# Patient Record
Sex: Male | Born: 1935 | Race: White | Hispanic: No | Marital: Single
Health system: Southern US, Community
[De-identification: ages and names within clinical notes are randomized; demographics above are authoritative.]

---

## 2005-01-18 ENCOUNTER — Ambulatory Visit: Payer: Self-pay | Admitting: Gastroenterology

## 2005-02-14 ENCOUNTER — Other Ambulatory Visit: Payer: Self-pay

## 2005-02-14 ENCOUNTER — Emergency Department: Payer: Self-pay | Admitting: Emergency Medicine

## 2007-02-20 ENCOUNTER — Other Ambulatory Visit: Payer: Self-pay

## 2007-02-20 ENCOUNTER — Inpatient Hospital Stay: Payer: Self-pay | Admitting: Internal Medicine

## 2008-03-01 ENCOUNTER — Other Ambulatory Visit: Payer: Self-pay

## 2008-03-01 ENCOUNTER — Emergency Department: Payer: Self-pay | Admitting: Emergency Medicine

## 2010-05-21 ENCOUNTER — Emergency Department: Payer: Self-pay | Admitting: Emergency Medicine

## 2011-05-14 ENCOUNTER — Emergency Department: Payer: Self-pay | Admitting: Emergency Medicine

## 2011-08-22 ENCOUNTER — Emergency Department: Payer: Self-pay | Admitting: Emergency Medicine

## 2012-04-15 LAB — COMPREHENSIVE METABOLIC PANEL
Albumin: 3.5 g/dL (ref 3.4–5.0)
Alkaline Phosphatase: 120 U/L (ref 50–136)
Anion Gap: 9 (ref 7–16)
BUN: 39 mg/dL — ABNORMAL HIGH (ref 7–18)
Bilirubin,Total: 0.3 mg/dL (ref 0.2–1.0)
EGFR (Non-African Amer.): 27 — ABNORMAL LOW
Glucose: 104 mg/dL — ABNORMAL HIGH (ref 65–99)
Osmolality: 295 (ref 275–301)
Potassium: 4 mmol/L (ref 3.5–5.1)
SGOT(AST): 27 U/L (ref 15–37)
Sodium: 143 mmol/L (ref 136–145)
Total Protein: 7 g/dL (ref 6.4–8.2)

## 2012-04-15 LAB — CBC WITH DIFFERENTIAL/PLATELET
Basophil #: 0 10*3/uL (ref 0.0–0.1)
Eosinophil #: 0 10*3/uL (ref 0.0–0.7)
MCH: 33.3 pg (ref 26.0–34.0)
MCHC: 33.8 g/dL (ref 32.0–36.0)
Monocyte #: 1.6 x10 3/mm — ABNORMAL HIGH (ref 0.2–1.0)
Neutrophil %: 73.4 %
Platelet: 151 10*3/uL (ref 150–440)
RBC: 3.4 10*6/uL — ABNORMAL LOW (ref 4.40–5.90)
RDW: 14.9 % — ABNORMAL HIGH (ref 11.5–14.5)
WBC: 12.8 10*3/uL — ABNORMAL HIGH (ref 3.8–10.6)

## 2012-04-15 LAB — TSH: Thyroid Stimulating Horm: 1.02 u[IU]/mL

## 2012-04-16 ENCOUNTER — Observation Stay: Payer: Self-pay | Admitting: Internal Medicine

## 2012-04-16 LAB — URINALYSIS, COMPLETE
Bilirubin,UR: NEGATIVE
Glucose,UR: NEGATIVE mg/dL (ref 0–75)
Nitrite: NEGATIVE
Protein: NEGATIVE
Specific Gravity: 1.011 (ref 1.003–1.030)
WBC UR: 1 /HPF (ref 0–5)

## 2012-04-16 LAB — CK: CK, Total: 408 U/L — ABNORMAL HIGH (ref 35–232)

## 2012-04-17 LAB — BASIC METABOLIC PANEL
Anion Gap: 6 — ABNORMAL LOW (ref 7–16)
BUN: 33 mg/dL — ABNORMAL HIGH (ref 7–18)
Calcium, Total: 8 mg/dL — ABNORMAL LOW (ref 8.5–10.1)
Chloride: 107 mmol/L (ref 98–107)
Creatinine: 1.91 mg/dL — ABNORMAL HIGH (ref 0.60–1.30)
EGFR (African American): 35 — ABNORMAL LOW
Glucose: 94 mg/dL (ref 65–99)
Osmolality: 290 (ref 275–301)
Potassium: 4.2 mmol/L (ref 3.5–5.1)

## 2012-04-17 LAB — CBC WITH DIFFERENTIAL/PLATELET
Basophil #: 0 10*3/uL (ref 0.0–0.1)
Basophil %: 0.3 %
Eosinophil %: 1.4 %
Lymphocyte %: 15.3 %
MCH: 33.5 pg (ref 26.0–34.0)
Monocyte %: 11.1 %
Neutrophil %: 71.9 %
Platelet: 141 10*3/uL — ABNORMAL LOW (ref 150–440)
RDW: 15.3 % — ABNORMAL HIGH (ref 11.5–14.5)
WBC: 11.9 10*3/uL — ABNORMAL HIGH (ref 3.8–10.6)

## 2012-04-17 LAB — URINE CULTURE

## 2013-08-19 DEATH — deceased

## 2014-03-19 IMAGING — CR DG LUMBAR SPINE 2-3V
1 series · 3 of 3 positions shown · non-contrast
Comparison: none

REASON FOR EXAM: s/p fall and back pain
COMMENTS:   LMP: (Male)

PROCEDURE:     DXR - DXR LUMBAR SPINE AP AND LATERAL  - April 16, 2012  [DATE]
RESULT:     Comparison: None

[Series 2: t lumbar spine ap · 0.14mm/px · 3 of 3 slices shown]
[im 1/3]
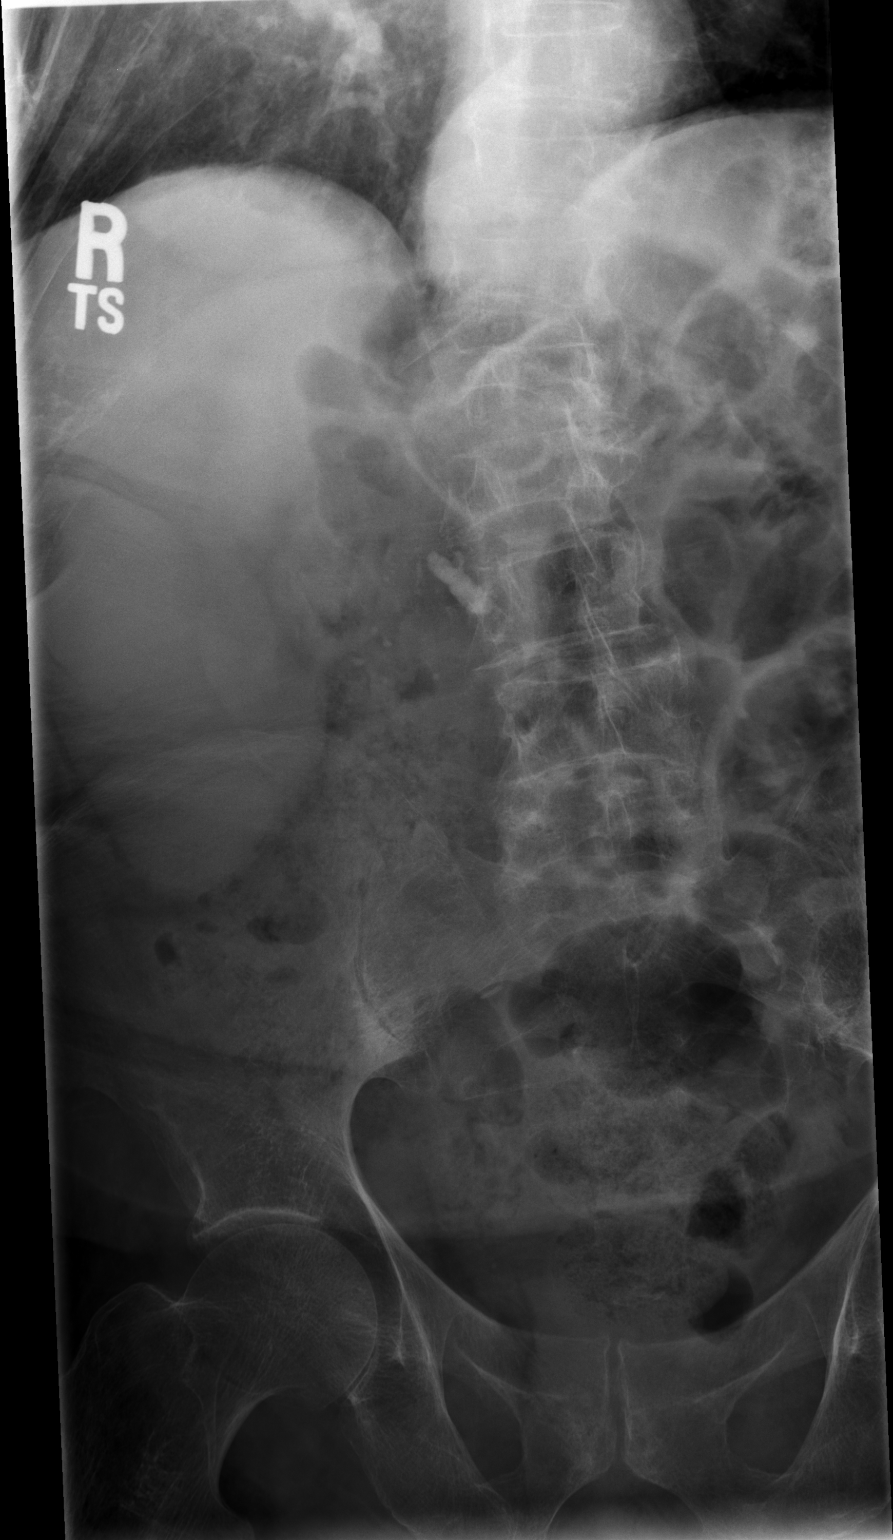
[im 2/3]
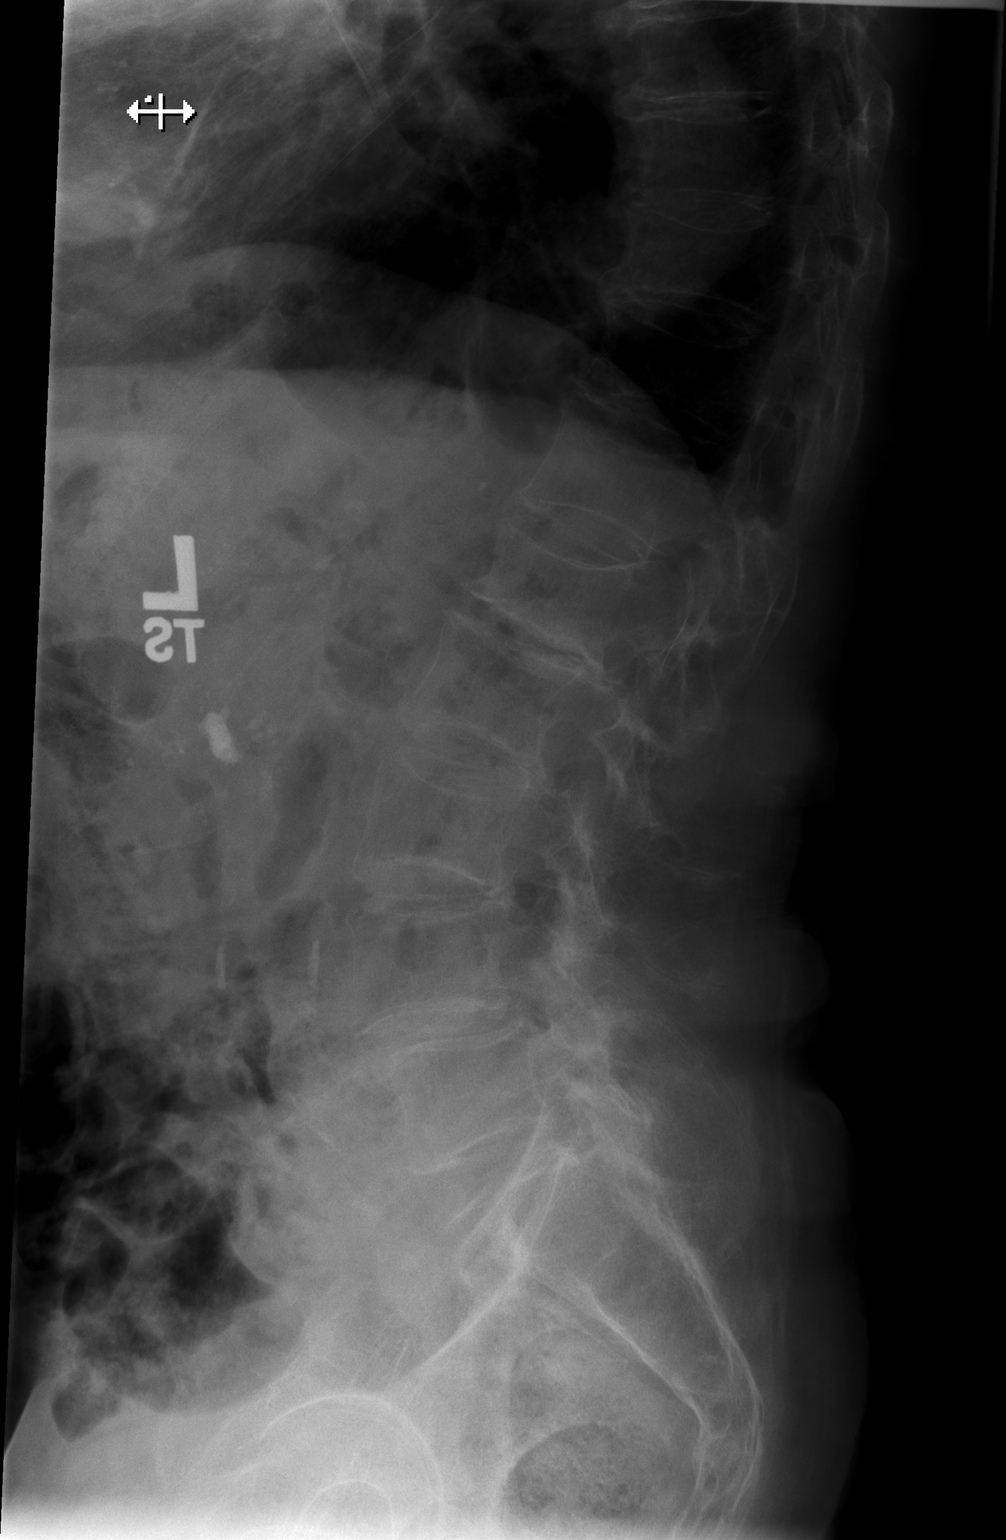
[im 3/3]
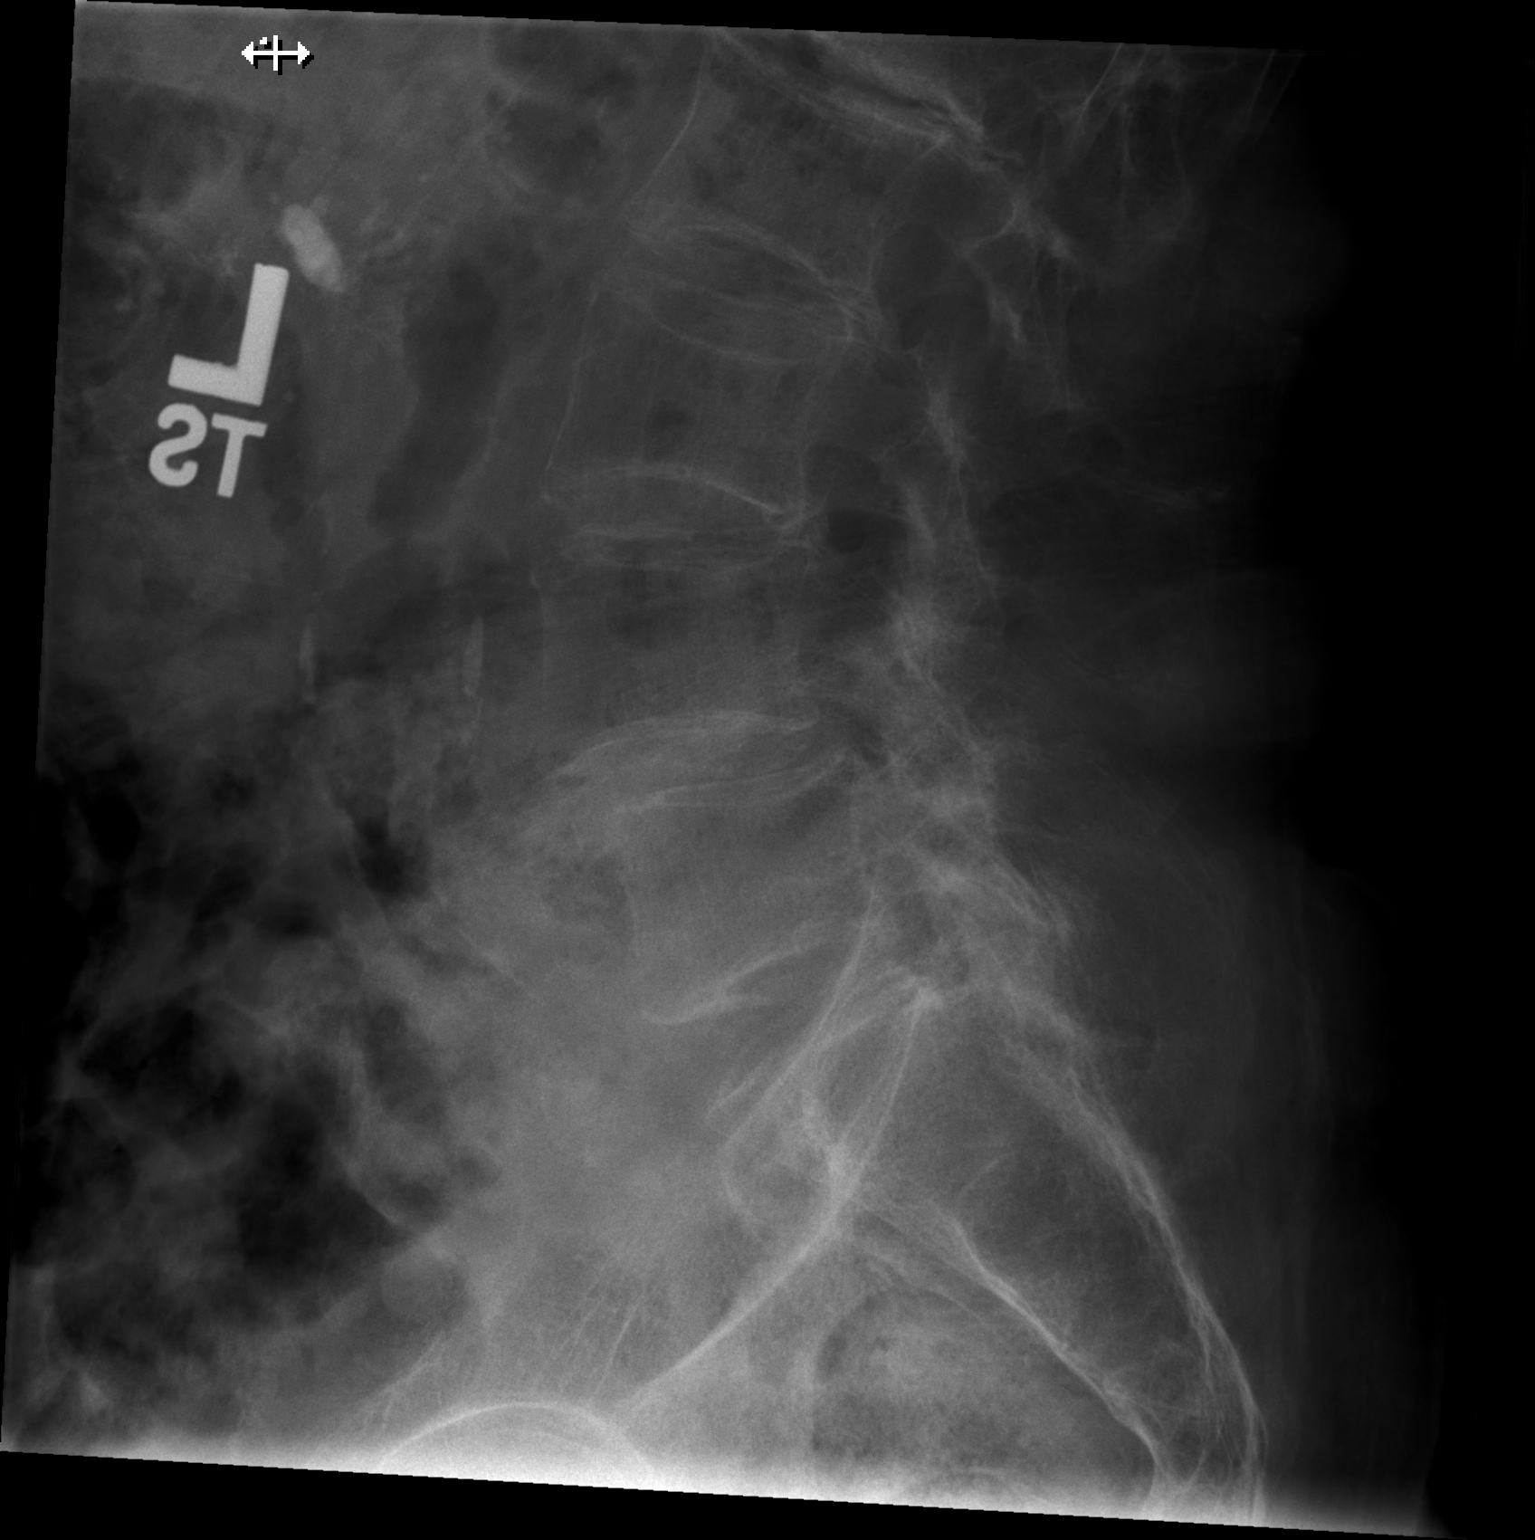

[3 of 3 positions shown; findings below may reference images not displayed]

FINDINGS: AP and lateral views of the lumbar spine and a coned down view of the
lumbosacral junction are provided.

There are 5 nonrib bearing lumbar-type vertebral bodies. The vertebral body
heights are maintained. The alignment is anatomic. There is no
spondylolysis. The there is grade 1 anterolisthesis of L4 on L5 likely
secondary to facet arthropathy. There is no acute fracture. There is mild
degenerative disc disease throughout the lumbar spine. There is severe
osteopenia.

There is a calcification projecting along the right was muscle at the level
of L3 which may represent a ureteral calcification.

There is mild degenerative change of the right SI joint.
IMPRESSION: 1. No acute osseous abnormality of the lumbar spine.

2. There is a calcification projecting along the right was muscle at the
level of L3 which may represent a ureteral calcification.

[REDACTED]

## 2015-01-06 NOTE — H&P (Signed)
PATIENT NAME:  Gary Chapman, Gary Chapman MR#:  [id number] DATE OF BIRTH:  1921-06-16  DATE OF ADMISSION:  04/16/2012  PRIMARY CARE PHYSICIAN: Diona Fanti, MD  CHIEF COMPLAINT: Status post fall, patient unable to stand with some degree of failure to thrive. Marland Kitchen   HISTORY OF PRESENT ILLNESS:  [name] is a very nice 81 year old gentleman who is currently living in an assisted living facility. He has significant past medical history of atrial fibrillation, hypertension, and hypothyroidism.  The patient is a poor historian, although he seems to be alert and oriented to person, place, and time, mostly whenever he is directed.    Today the patient comes to the ER because there is a complaint that he cannot stand up.  He was brought by the people at the assisted living facility. Apparently he had a fall yesterday evening which happened after tripping and falling landing apparently on his back hurting his buttocks area and lower back.   The patient states he has significant pain that is mostly continuous, but it waxes and wanes and right now is minimal. It is like 1 or 2 out of 10. The pain does not radiate. It is mostly located in the lower spine and left buttocks area. He is able to move all four extremities. He is just really weak and cannot stand due to the pain.  He was going to be discharged back to the facility, although they were not able to take care of him in the condition of being nonambulatory. For that reason, we are going to admit. X-rays are ordered to rule out any type of fracture, either lumbar spine versus hip fractures.  During my conversation with the patient, the patient seems to be comfortable. He denies any signs of infection, no fever. He denies any significant coronary artery disease, but he does have atrial fibrillation. He is not quite aware of all the medications that he takes, but he knows there are some for his heart and some for his blood pressure and his thyroid.   He does not have any family  around. He states that his closest family is a niece and she lives in [place].    The patient was not able to give me a good answer about his life advanced directives. For now he is going to be a FULL CODE.   REVIEW OF SYSTEMS: CONSTITUTIONAL: The patient denies any fever, fatigue. He is weak, mostly in his lower extremities. He denies any weight loss or weight gain.  EYES: History of conjunctivitis in the past. No significant changes in his vision at the moment. No inflammation. ENT: Denies any tinnitus. He does have some significant hearing loss. He denies any epistaxis, rhinorrhea or signs of upper respiratory infection. PULMONARY: He denies any cough, wheezing, hemoptysis, or dyspnea. CARDIOVASCULAR: He denies any chest pain, shortness of breath, orthopnea, or nocturnal dyspnea. GASTROINTESTINAL: Denies any abdominal pain, constipation, or diarrhea. He says he has been having regular bowel movements. He has mild GERD for what he takes omeprazole. GENITOURINARY: He does have significant BPH and he is always having trouble initiating urination, but at this moment no dysuria or hematuria. No penile or sacral lesions. ENDOCRINE: He denies any polyuria or polydipsia, or polyphagia. He does have thyroid disease, but it has been well controlled. No cold or heat intolerance or signs of thyroid instability. HEMATOLOGIC/LYMPHATIC: Denies any easy bruising, bleeding, or swollen glands.  SKIN: Denies any significant rashes or petechiae. He does have sensitive skin and easy breakdown of  it. MUSCULOSKELETAL: As mentioned above, lower back pain and hip pain and buttock area pain. NEUROLOGIC: He denies any headaches, CVA, or TIA. He does have some mild tremor which is mostly with action. PSYCHIATRIC: Denies any significant depression or anxiety.   PAST MEDICAL HISTORY:  1. Hypertension. 2. Atrial fibrillation.  3. Hypothyroidism.  4. Hyperlipidemia.  5. History of falls.  6. Gastroesophageal reflux  disease. 7. Benign prostatic hypertrophy.   PAST SURGICAL HISTORY: Appendectomy.  DRUG ALLERGIES: Penicillin.  SOCIAL HISTORY: The patient is single. He lives in an assisted living facility.  He has a niece who lives in [place]. He denies ever smoking. He used to drink a long time ago, but not currently. He denies any drug use. He is retired. He used to be in [employer].   FAMILY HISTORY: Positive for stroke in his sister. Negative for cancer.   MEDICATIONS: 1. Tamsulosin 0.5 mg twice daily. 2. Simvastatin 20 mg once daily. 3. Primidone 250 mg once daily. 4. Omeprazole 20 mg once daily. 5. Metoprolol 25 mg once daily. 6. Loratadine 10 mg once daily. 7. Levothyroxine 125 mcg once daily. 8. Artificial Tears. 9. Amiodarone 200 mg once daily.  PHYSICAL EXAMINATION:   VITALS: Blood pressure 153/64, respirations 18, pulse 70, and temperature 99.6.  GENERAL: He is alert and oriented, mostly on person and place, but he is able to determine time with some guidance. He is in no acute distress, but the patient looks chronically debilitated. He is hemodynamically stable and he looks a little bit dry to me at this moment.   HEENT: Pupils are equal and reactive. Extraocular movements are intact. Mucosa is dry. No thrush. No oropharyngeal exudates. No icterus of sclerae. Conjunctivae is pink. The patient is normocephalic, atraumatic. There is some periorbital edema. The patient states this is normal for him.   NECK: Supple. No JVD, no thyromegaly, no adenopathy, and no carotid bruits.  CARDIOVASCULAR: Regular rate and rhythm. No murmurs, rubs or gallops.   PULMONARY: Lungs are clear with good air entrance. No wheezing or rales.  ABDOMEN: Soft, nontender, and nondistended. No hepatosplenomegaly. No masses. Bowel sounds are positive.   GENITOURINARY: Normal without any lesions.  EXTREMITIES: No significant cyanosis or clubbing. There is mild edema of the right ankle. The patient states it is  always like that, but there is no pitting edema. There is no pretibial edema. Denna HaggardHomans is negative.   MUSCULOSKELETAL: Range of motion of movement of the lower extremities is decreased due to old age and arthritis, although there is no significant pain at the level of the hip with adduction or abduction. There is no significant pain on palpation of the lumbar spine on the five vertebrae spinal processes. There is some mild pain on palpation of the left upper gluteal area. There is no significant bruising.   NEURO: Cranial nerves II through XII are intact. No focal findings. Strength is 4 out of 5 in all four extremities. Sensation is maintained distally.   PSYCH: Negative for depression or agitation. The patient is actually very pleasant and very friendly.   SKIN: No significant petechiae or rashes. There is some fragility of the skin on upper extremities, but no significant ecchymosis.   LYMPH: Negative for lymphadenopathy of neck or axilla or epitrochlear or supraclavicular.   LABS/RADIOLOGIC STUDIES: Glucose 104, creatinine 2.9. GFR is about 30; his baseline is in between 1.9 and 2.1. Calcium is 8.3. LFTs are within normal limits. TSH 1.02. Troponin is less than 0.02.  White blood cells 12.8, hemoglobin 11.3, and hematocrit 33.  EKG: Huston Foley with first degree AV block, bifascicular block and no signs of acute ST or T waves.   Chest x-ray: No significant acute abnormality or infiltrates. There is significant calcification of the aorta.   ASSESSMENT AND PLAN:  1. Generalized weakness. The patient is admitted due to the fact that he had a fall and right now he is not able to stand up or ambulate very well. He lived in an assistant living facility and unfortunately they are not able to take care of him in his condition for what we are going to admit for observation and possible placement.  2. Status post fall. The patient has significant pain of the lower spine and left hip. Orders for x-ray of the  lower spine and hips have been entered to evaluate for possible fracture versus soft tissue trauma.  3. Physical therapy and occupational therapy are going to be ordered for tomorrow.  4. Failure to thrive. We are going to get some different tests. The patient is taking a statin. This is an 81 year old gentleman. He might need a drug holiday. He does not have significant coronary artery disease. I am going to get a CK level just to make sure it is not affecting him. His LFTs are normal.  5. Atrial fibrillation. The patient has history of chronic atrial fibrillation. His currently in normal sinus rhythm with bradycardia and first degree AV block. The patient takes amiodarone and metoprolol for this. Rate is controlled. 6. Hypertension. Continue metoprolol. Blood pressure seems to be stable.  7. Elevated white blood count. His white count is 12.8. There are no significant signs of infection. This could be due to trauma and stress, although we are going to go ahead and get cultures ordered, urinalysis, and urine culture. No signs of infiltrates like pneumonia on his chest x-ray.  8. Dyslipidemia, as mentioned above. 9. Chronic kidney disease, stage IV. Stable and at baseline.  10. Hypothyroidism. Continue Synthroid. Normal TSH. 11. Gastroesophageal reflux disease. On GI prophylaxis. Continue omeprazole. 12. DVT prophylaxis. The patient actually has history of atrial fibrillation, but he has been taken off Coumadin due to multiple falls and risk of significant bleeding. At this moment, we are going to do heparin every 12 hours due to his kidney failure, but we are not thinking on restarting any type of anticoagulation, this going to be only prophylaxis.   CODE STATUS: FULL CODE. The patient was not able to give me a good answer about his code status.   TIME SPENT WITH PATIENT: About 45 minutes.  ____________________________ Felipa Furnace, MD [id number] D: 04/16/2012 02:42:58 ET T: 04/16/2012  09:10:12 ET JOB#: [id number]  cc: Felipa Furnace, MD, <Dictator> Carla Drape, MD Pearletha Furl MD ELECTRONICALLY SIGNED 05/05/2012 13:07

## 2015-01-06 NOTE — Discharge Summary (Signed)
PATIENT NAME:  Gary Chapman, Gary Chapman MR#:  [id number] DATE OF BIRTH:  12/02/1920  DATE OF ADMISSION:  04/16/2012 DATE OF DISCHARGE:  04/18/2012  HISTORY OF PRESENT ILLNESS: Mr. Weisel is  an 81 year old gentleman who had been living in [facility], which is an assisted living facility. The patient was brought to the ER because he had had multiple falls and had become nonambulatory. Because of this the facility was no longer able to care for him. He was complaining of low back pain. X-rays done in the Emergency Room, however, were relatively unremarkable.   PAST MEDICAL HISTORY:  1. Hypertension.  2. Chronic atrial fibrillation.  3. Hypothyroidism.  4. Hyperlipidemia.  5. Gastroesophageal reflux disease. 6. Benign prostatic hypertrophy.    PAST SURGICAL HISTORY:  Appendectomy.   ALLERGIES: Penicillin.   MEDICATIONS ON ADMISSION:  1. Flomax 0.5 mg b.i.d.  2. Simvastatin 20 mg daily.  3. Primidone 250 mg daily.  4. Omeprazole 20 mg daily.  5. Metoprolol 25 mg daily.  6. Claritin 10 mg daily.  7. Levoxyl 125 mcg daily.  8. Amiodarone 200 mg daily.   ADMISSION PHYSICAL EXAMINATION: Blood pressure 153/64, respirations were 18, pulse 70, temperature was 99.6. Admission physical examination as described by the admitting physician was most notable for dry mucous membranes and poor skin turgor. Despite his history of atrial fibrillation, he was noted to have a regular rate and rhythm. Lungs were clear. The abdomen was soft and nontender. He had some edema of the right ankle. He had generalized changes of osteoarthritis. There was no significant bruising. Neurological examination was felt to be physiological.   LABORATORY, DIAGNOSTIC, AND RADIOLOGICAL DATA:   Admission CBC showed a hemoglobin of 11.3 with hematocrit of 33.5. White count was 12,800. Platelet count was 151,000. Admission comprehensive metabolic panel showed a BUN of 39 with a creatinine of 2.09. Electrolytes were normal. Estimated GFR  was 27. TSH was 1.02. CPK was elevated at 408 but troponin was negative. Urinalysis showed 1+ blood on the dipstick but only two RBCs per high-power field on the microscopic. Subsequent urine culture showed mixed organisms suggestive of contamination. Electrocardiogram showed a normal sinus rhythm with first-degree AV block, right bundle branch block, and left anterior fascicular block. Admission chest x-ray, routine views of the LS-spine, routine views of the right and left hips were all within normal limits. Unenhanced head CT done in the Emergency Room showed moderate diffuse age-appropriate cerebral and cerebellar atrophy. There were no acute changes, however.   The patient was admitted to the regular medical floor where he was rehydrated with IV fluids. He also was started with physical therapy. His final basic metabolic panel prior to transfer showed a BUN of 33 with a creatinine 1.91. Estimated GFR was 30. His final CBC showed a hemoglobin of 10.4 with hematocrit of 30.6. White count was 11,900. Platelet count was 141,000.   DISCHARGE DIAGNOSES:  1. Senile failure to thrive.  2. Dehydration.  3. History of hypertension.  4. History of atrial fibrillation.  5. Hypothyroidism.  6. Gastroesophageal reflux disease.  7. History of benign prostatic hypertrophy.   DISCHARGE DISPOSITION: The patient is being discharged on a mechanical soft diet with activity as tolerated.  He will continue to receive physical therapy.   DISCHARGE MEDICATIONS:  1. Amiodarone 200 mg daily.  2. Mysoline 250 mg daily.  3. Flomax 0.4 mg twice a day.  4. Claritin 10 mg daily.  5. Zocor 20 mg at bedtime.  6. Metoprolol 25  mg daily.  7. Omeprazole 20 mg daily.  8. Synthroid 125 mcg daily.  9. Tylenol 650 mg every four hours p.r.n.  10. Colace 100 mg b.i.d. p.r.n.   CODE STATUS: The patient is a FULL CODE.  ____________________________ Letta PateJohn B. Danne HarborWalker III, MD [id number] D: 04/18/2012 07:15:27 ET T: 04/18/2012  07:40:13 ET JOB#: [id number]  cc: Jonny RuizJohn B. Danne HarborWalker III, MD, <Dictator> Elmo PuttJOHN B WALKER III MD ELECTRONICALLY SIGNED 04/19/2012 8:13
